# Patient Record
Sex: Male | Born: 1945 | Race: Black or African American | Hispanic: No | Smoking: Current every day smoker
Health system: Southern US, Community
[De-identification: ages and names within clinical notes are randomized; demographics above are authoritative.]

## PROBLEM LIST (undated history)

## (undated) DIAGNOSIS — E119 Type 2 diabetes mellitus without complications: Secondary | ICD-10-CM

## (undated) DIAGNOSIS — E7800 Pure hypercholesterolemia, unspecified: Secondary | ICD-10-CM

## (undated) DIAGNOSIS — I10 Essential (primary) hypertension: Secondary | ICD-10-CM

---

## 2010-12-16 ENCOUNTER — Ambulatory Visit: Payer: Self-pay

## 2011-04-29 ENCOUNTER — Inpatient Hospital Stay: Payer: Self-pay | Admitting: Internal Medicine

## 2012-04-11 IMAGING — CT CT ABD-PELV W/O CM
1 of 2 series · 15 of 32 positions shown, 19 images · non-contrast
Comparison: None

REASON FOR EXAM: (1) severe lower abd pain; (2) see above; po contrast
only
COMMENTS:

PROCEDURE:     CT  - CT ABDOMEN AND PELVIS W[DATE]  [DATE]
RESULT:     Indication: Lower abdominal pain
TECHNIQUE: Multiple axial images from the lung bases to the symphysis pubis
were obtained with oral and without intravenous contrast.

[Series 2: 3mm soft tissue · axial · 0.79mm/px · z∈[-836,-395]mm · 15 of 161 slices shown, 19 images]
[im 7/161  soft-tissue]
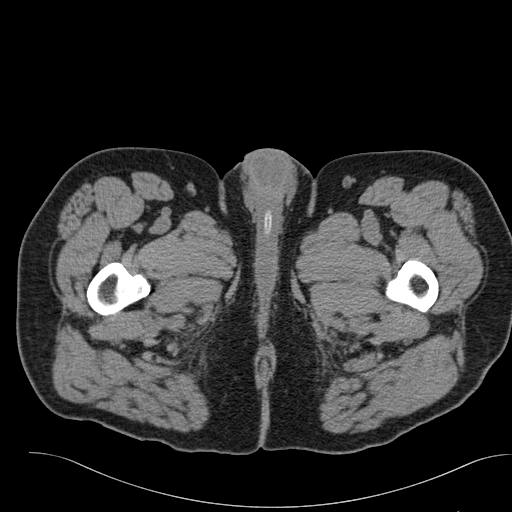
[im 7/161  bone]
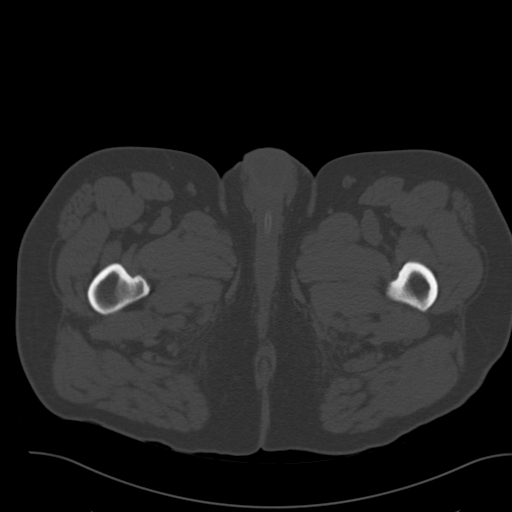
[im 20/161  soft-tissue]
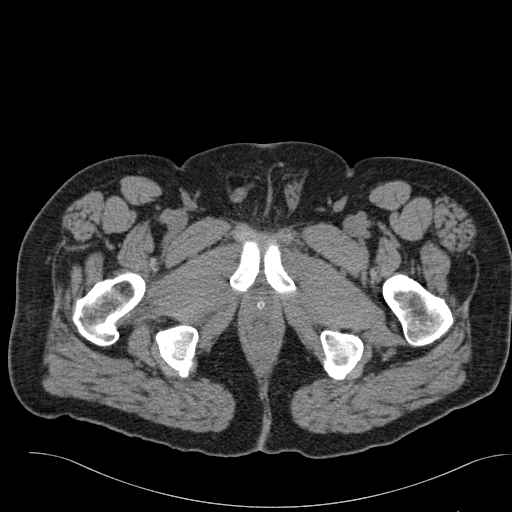
[im 33/161  soft-tissue]
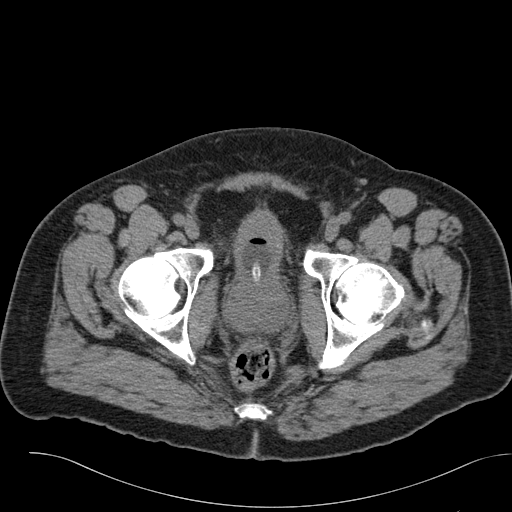
[im 45/161  soft-tissue]
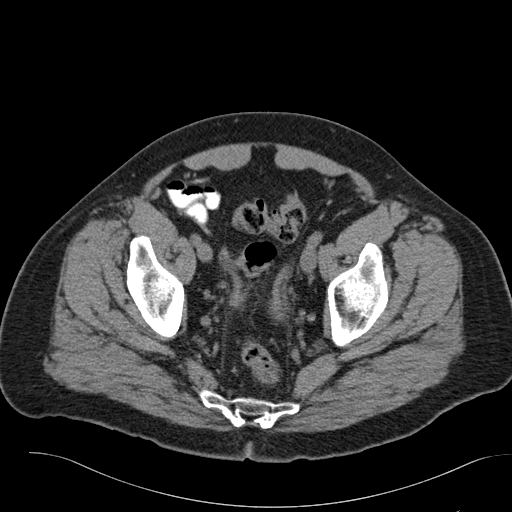
[im 58/161  soft-tissue]
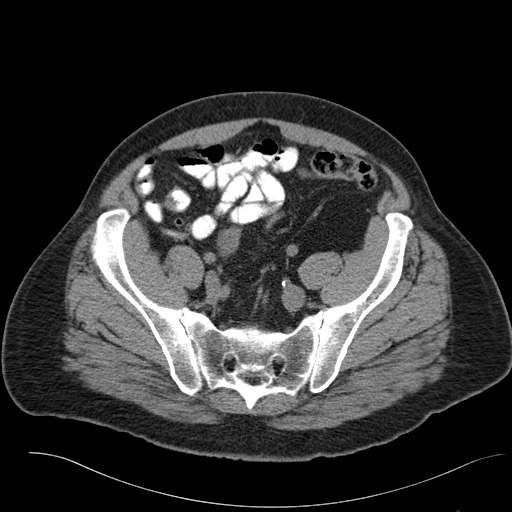
[im 71/161  soft-tissue]
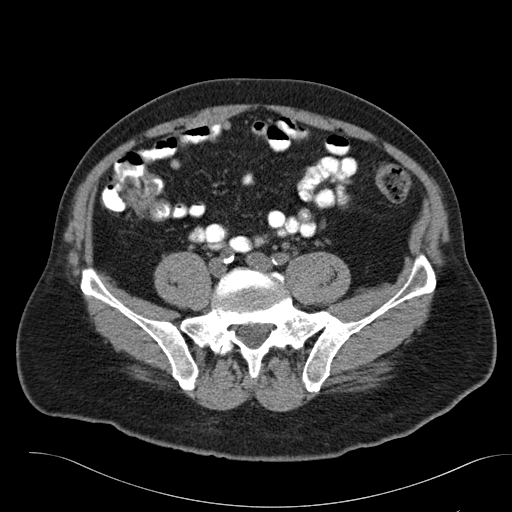
[im 84/161  soft-tissue]
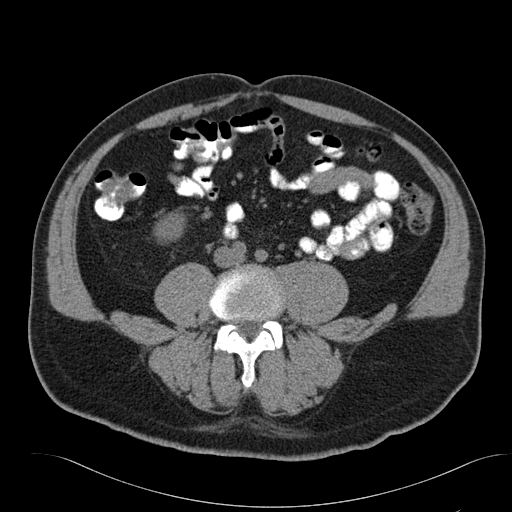
[im 90/161  soft-tissue]
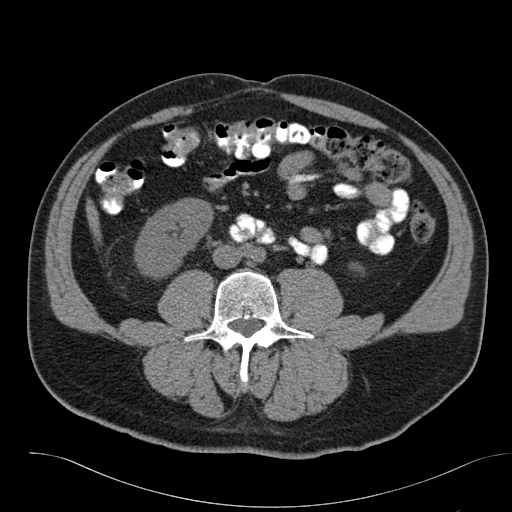
[im 103/161  soft-tissue]
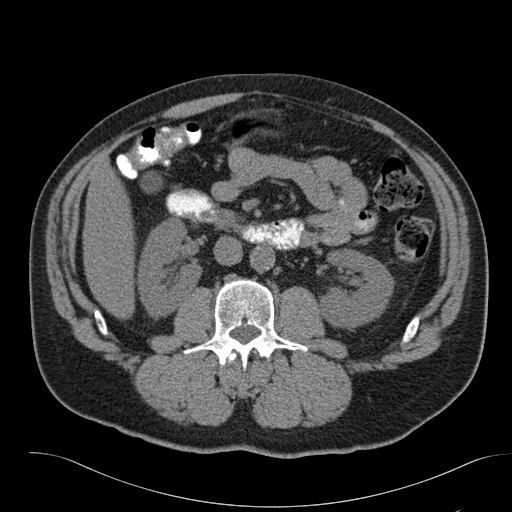
[im 103/161  bone]
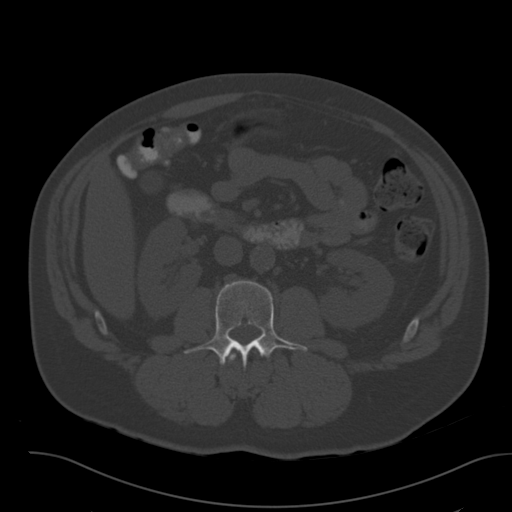
[im 116/161  soft-tissue]
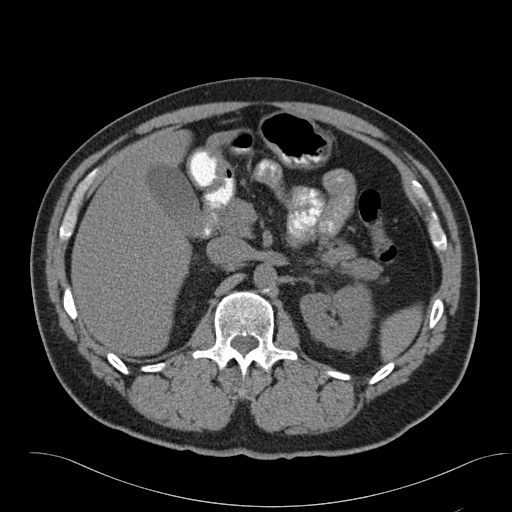
[im 129/161  soft-tissue]
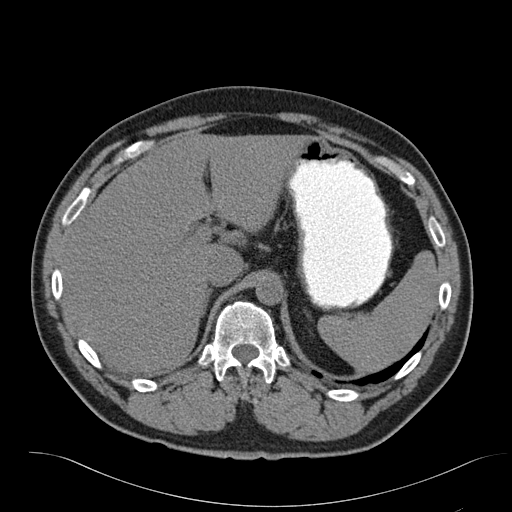
[im 135/161  lung]
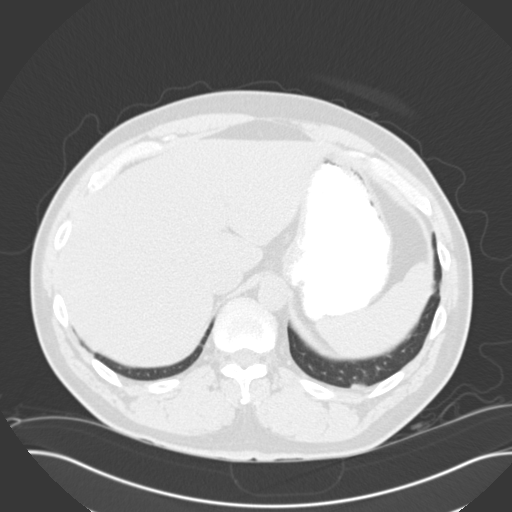
[im 141/161  soft-tissue]
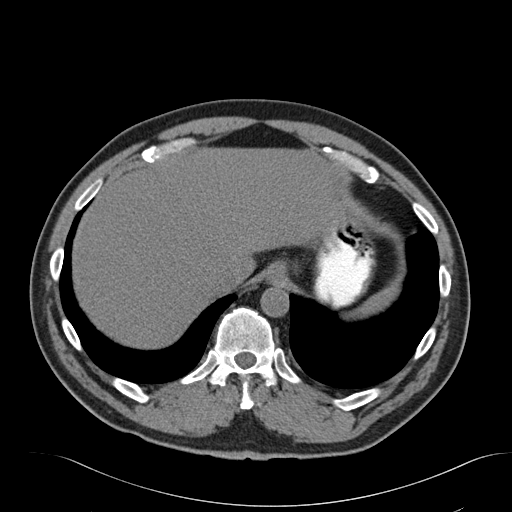
[im 141/161  lung]
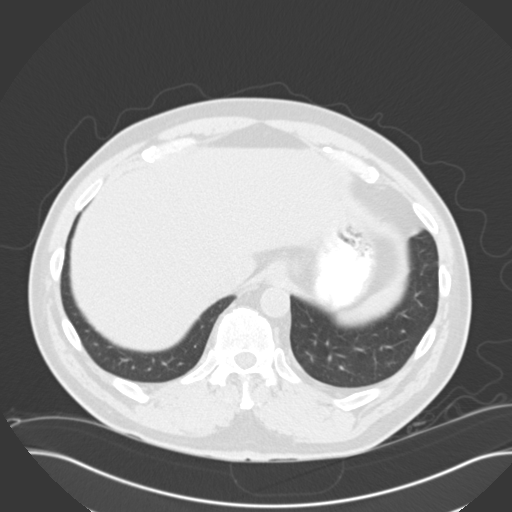
[im 148/161  lung]
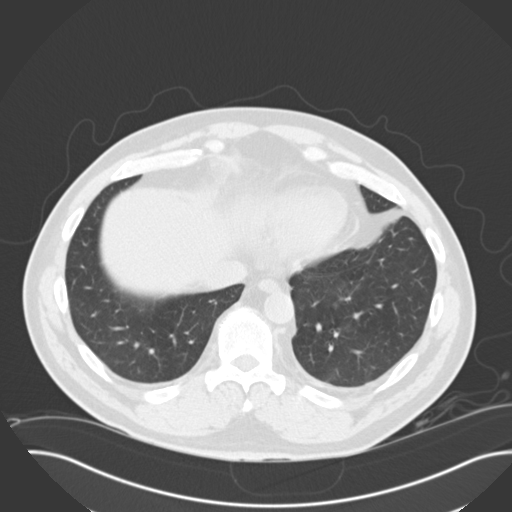
[im 154/161  soft-tissue]
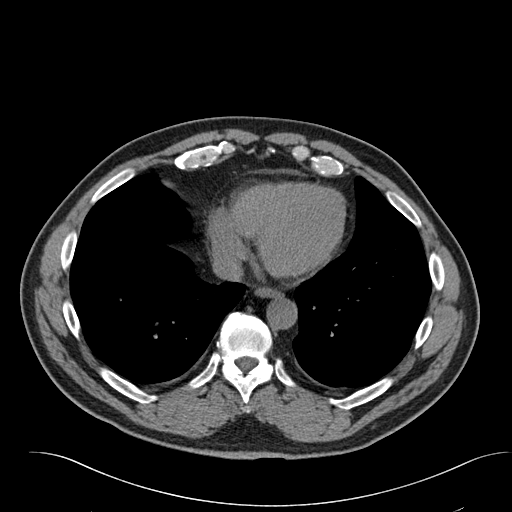
[im 154/161  lung]
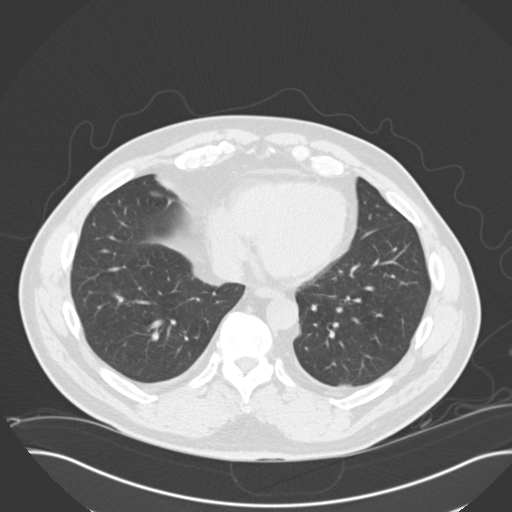

[15 of 32 positions shown; findings below may reference images not displayed]

FINDINGS: The lung bases are clear. There is no pleural or pericardial effusions.

No renal, ureteral, or bladder calculi. No obstructive uropathy. No
perinephric stranding is seen. The kidneys are symmetric in size without
evidence for exophytic mass. The prostate gland is enlarged measuring 5.5 x
5.6 cm. There is a Foley catheter within the bladder.

The liver demonstrates no focal abnormality. The gallbladder is
unremarkable. The spleen demonstrates no focal abnormality. The adrenal
glands and pancreas are normal.

The unopacified stomach, duodenum, small intestine, and large intestine are
unremarkable, but evaluation is limited by lack of oral contrast. There is a
normal caliber appendix in the right lower quadrant without periappendiceal
inflammatory changes. There is no pneumoperitoneum, pneumatosis, or portal
venous gas. There is no abdominal or pelvic free fluid. There is no
lymphadenopathy.

The abdominal aorta is normal in caliber with atherosclerosis.

The osseous structures are unremarkable.
IMPRESSION: 1. No urolithiasis or obstructive uropathy.

2. Prostatic enlargement. Correlate with PSA and physical exam.

## 2015-01-22 ENCOUNTER — Emergency Department
Admission: EM | Admit: 2015-01-22 | Discharge: 2015-01-22 | Disposition: A | Discharge status: Home / Self Care | Payer: Commercial Managed Care - HMO | Attending: Emergency Medicine | Admitting: Emergency Medicine

## 2015-01-22 ENCOUNTER — Encounter: Payer: Self-pay | Admitting: Emergency Medicine

## 2015-01-22 DIAGNOSIS — I10 Essential (primary) hypertension: Secondary | ICD-10-CM | POA: Insufficient documentation

## 2015-01-22 DIAGNOSIS — Z880 Allergy status to penicillin: Secondary | ICD-10-CM | POA: Diagnosis not present

## 2015-01-22 DIAGNOSIS — Z79899 Other long term (current) drug therapy: Secondary | ICD-10-CM | POA: Diagnosis not present

## 2015-01-22 DIAGNOSIS — Z720 Tobacco use: Secondary | ICD-10-CM | POA: Diagnosis not present

## 2015-01-22 DIAGNOSIS — E119 Type 2 diabetes mellitus without complications: Secondary | ICD-10-CM | POA: Diagnosis not present

## 2015-01-22 DIAGNOSIS — M79674 Pain in right toe(s): Secondary | ICD-10-CM | POA: Diagnosis present

## 2015-01-22 DIAGNOSIS — M109 Gout, unspecified: Secondary | ICD-10-CM

## 2015-01-22 HISTORY — DX: Essential (primary) hypertension: I10

## 2015-01-22 HISTORY — DX: Pure hypercholesterolemia, unspecified: E78.00

## 2015-01-22 HISTORY — DX: Type 2 diabetes mellitus without complications: E11.9

## 2015-01-22 LAB — BASIC METABOLIC PANEL
Anion gap: 7 (ref 5–15)
BUN: 14 mg/dL (ref 6–20)
CALCIUM: 9.2 mg/dL (ref 8.9–10.3)
CO2: 31 mmol/L (ref 22–32)
Chloride: 101 mmol/L (ref 101–111)
Creatinine, Ser: 1.39 mg/dL — ABNORMAL HIGH (ref 0.61–1.24)
GFR calc Af Amer: 59 mL/min — ABNORMAL LOW (ref >60)
GFR, EST NON AFRICAN AMERICAN: 51 mL/min — AB (ref >60)
Glucose, Bld: 108 mg/dL — ABNORMAL HIGH (ref 65–99)
Potassium: 4 mmol/L (ref 3.5–5.1)
SODIUM: 139 mmol/L (ref 135–145)

## 2015-01-22 LAB — CBC WITH DIFFERENTIAL/PLATELET
Basophils Absolute: 0.1 10*3/uL (ref 0–0.1)
Basophils Relative: 1 %
EOS ABS: 0.1 10*3/uL (ref 0–0.7)
Eosinophils Relative: 2 %
HEMATOCRIT: 48.8 % (ref 40.0–52.0)
Hemoglobin: 15.8 g/dL (ref 13.0–18.0)
LYMPHS ABS: 2.7 10*3/uL (ref 1.0–3.6)
Lymphocytes Relative: 33 %
MCH: 27.2 pg (ref 26.0–34.0)
MCHC: 32.5 g/dL (ref 32.0–36.0)
MCV: 83.8 fL (ref 80.0–100.0)
Monocytes Absolute: 0.6 10*3/uL (ref 0.2–1.0)
Monocytes Relative: 7 %
Neutro Abs: 4.7 10*3/uL (ref 1.4–6.5)
Neutrophils Relative %: 57 %
Platelets: 243 10*3/uL (ref 150–440)
RBC: 5.83 MIL/uL (ref 4.40–5.90)
RDW: 14.7 % — AB (ref 11.5–14.5)
WBC: 8.2 10*3/uL (ref 3.8–10.6)

## 2015-01-22 LAB — URIC ACID: URIC ACID, SERUM: 8.1 mg/dL — AB (ref 4.4–7.6)

## 2015-01-22 MED ORDER — COLCHICINE 0.6 MG PO TABS: 0.6000 mg | ORAL_TABLET | Freq: Every day | ORAL | Status: AC

## 2015-01-22 MED ORDER — NAPROXEN 500 MG PO TABS: 500.0000 mg | ORAL_TABLET | Freq: Two times a day (BID) | ORAL | Status: DC

## 2015-01-22 NOTE — ED Provider Notes (Signed)
[facility] Emergency Department Provider Note  ____________________________________________  Time seen: Approximately 10:47 AM  I have reviewed the triage vital signs and the nursing notes.   HISTORY  Chief Complaint Toe Pain    HPI Anthony Roy is a 69 y.o. male complain about 3 days of swelling and redness to the right hallux. Patient denies any provocative incident for this complaint. Patient state is only painful impressions applied against the swelling. At that time the pain is dull and he rates it as a 4/10. No palliative measures taken for this complaint.   Past Medical History  Diagnosis Date  . Diabetes mellitus without complication   . Hypertension   . Hypercholesteremia     There are no active problems to display for this patient.   History reviewed. No pertinent past surgical history.  Current Outpatient Rx  Name  Route  Sig  Dispense  Refill  . atorvastatin (LIPITOR) 40 MG tablet   Oral   Take 40 mg by mouth daily.         . hydrochlorothiazide (HYDRODIURIL) 25 MG tablet   Oral   Take 25 mg by mouth daily.         Marland Kitchen lisinopril (PRINIVIL,ZESTRIL) 40 MG tablet   Oral   Take 40 mg by mouth daily.         . metFORMIN (GLUCOPHAGE) 500 MG tablet   Oral   Take 500 mg by mouth 2 (two) times daily with a meal.         . tamsulosin (FLOMAX) 0.4 MG CAPS capsule   Oral   Take 0.4 mg by mouth.         . colchicine 0.6 MG tablet   Oral   Take 1 tablet (0.6 mg total) by mouth daily.   30 tablet   2   . naproxen (NAPROSYN) 500 MG tablet   Oral   Take 1 tablet (500 mg total) by mouth 2 (two) times daily with a meal.   20 tablet   0     Allergies Penicillins  No family history on file.  Social History History  Substance Use Topics  . Smoking status: Current Every Day Smoker  . Smokeless tobacco: Not on file  . Alcohol Use: No    Review of Systems Constitutional: No fever/chills Eyes: No visual  changes. ENT: No sore throat. Cardiovascular: Denies chest pain. Respiratory: Denies shortness of breath. Gastrointestinal: No abdominal pain.  No nausea, no vomiting.  No diarrhea.  No constipation. Genitourinary: Negative for dysuria. Musculoskeletal: Negative for back pain. Skin: Negative for rash. Neurological: Negative for headaches, focal weakness or numbness. Endocrine:Diabetes hypertension and hyperlipidemia. Allergic/Immunilogical: Penicillin  10-point ROS otherwise negative.  ____________________________________________   PHYSICAL EXAM:  VITAL SIGNS: ED Triage Vitals  Enc Vitals Group     BP 01/22/15 1043 170/74 mmHg     Pulse Rate 01/22/15 1043 85     Resp --      Temp 01/22/15 1043 98.3 F (36.8 C)     Temp Source 01/22/15 1043 Oral     SpO2 01/22/15 1043 95 %     Weight 01/22/15 1043 240 lb (108.863 kg)     Height 01/22/15 1043 6\' 2"  (1.88 m)     Head Cir --      Peak Flow --      Pain Score --      Pain Loc --      Pain Edu? --  Excl. in GC? --     Constitutional: Alert and oriented. Well appearing and in no acute distress. Eyes: Conjunctivae are normal. PERRL. EOMI. Head: Atraumatic. Nose: No congestion/rhinnorhea. Mouth/Throat: Mucous membranes are moist.  Oropharynx non-erythematous. Neck: No stridor. No cervical spine tenderness to palpation. Hematological/Lymphatic/Immunilogical: No cervical lymphadenopathy. Cardiovascular: Normal rate, regular rhythm. Grossly normal heart sounds.  Good peripheral circulation. Elevated systolic blood pressure Respiratory: Normal respiratory effort.  No retractions. Lungs CTAB. Gastrointestinal: Soft and nontender. No distention. No abdominal bruits. No CVA tenderness. Genitourinary:  Musculoskeletal: Obvious edema to the right hallux. Neurologic:  Normal speech and language. No gross focal neurologic deficits are appreciated. No gait instability. Skin:  Medial aspect of the right hallux is erythematous and  edematous. Psychiatric: Mood and affect are normal. Speech and behavior are normal.  ____________________________________________   LABS (all labs ordered are listed, but only abnormal results are displayed)  Labs Reviewed  URIC ACID - Abnormal; Notable for the following:    Uric Acid, Serum 8.1 (*)    All other components within normal limits  CBC WITH DIFFERENTIAL/PLATELET - Abnormal; Notable for the following:    RDW 14.7 (*)    All other components within normal limits  BASIC METABOLIC PANEL - Abnormal; Notable for the following:    Glucose, Bld 108 (*)    Creatinine, Ser 1.39 (*)    GFR calc non Af Amer 51 (*)    GFR calc Af Amer 59 (*)    All other components within normal limits   ____________________________________________  EKG   ____________________________________________  RADIOLOGY   ____________________________________________   PROCEDURES  Procedure(s) performed: None  Critical Care performed: No  ____________________________________________   INITIAL IMPRESSION / ASSESSMENT AND PLAN / ED COURSE  Pertinent labs & imaging results that were available during my care of the patient were reviewed by me and considered in my medical decision making (see chart for details).  Patient physical exam and labs reveal that this is a onset of gout. Patient given prescription put in the INaproxen and Colchicine. [facility]. FINAL CLINICAL IMPRESSION(S) / ED DIAGNOSES  Final diagnoses:  Gout of big toe      Joni Reining, PA-C 01/22/15 1219  Phineas Semen, MD 01/22/15 1236

## 2015-01-22 NOTE — Discharge Instructions (Signed)
°  Advised to follow up with [facility] [place] for continual care. Gout Gout is when your joints become red, sore, and swell (inflamed). This is caused by the buildup of uric acid crystals in the joints. Uric acid is a chemical that is normally in the blood. If the level of uric acid gets too high in the blood, these crystals form in your joints and tissues. Over time, these crystals can form into masses near the joints and tissues. These masses can destroy bone and cause the bone to look misshapen (deformed). HOME CARE   Do not take aspirin for pain.  Only take medicine as told by your doctor.  Rest the joint as much as you can. When in bed, keep sheets and blankets off painful areas.  Keep the sore joints raised (elevated).  Put warm or cold packs on painful joints. Use of warm or cold packs depends on which works best for you.  Use crutches if the painful joint is in your leg.  Drink enough fluids to keep your pee (urine) clear or pale yellow. Limit alcohol, sugary drinks, and drinks with fructose in them.  Follow your diet instructions. Pay careful attention to how much protein you eat. Include fruits, vegetables, whole grains, and fat-free or low-fat milk products in your daily diet. Talk to your doctor or dietitian about the use of coffee, vitamin C, and cherries. These may help lower uric acid levels.  Keep a healthy body weight. GET HELP RIGHT AWAY IF:   You have watery poop (diarrhea), throw up (vomit), or have any side effects from medicines.  You do not feel better in 24 hours, or you are getting worse.  Your joint becomes suddenly more tender, and you have chills or a fever. MAKE SURE YOU:   Understand these instructions.  Will watch your condition.  Will get help right away if you are not doing well or get worse. Document Released: 03/10/2008 Document Revised: 10/16/2013 Document Reviewed: 01/13/2012 [facility] Patient Information 2015 [place], [place]. This information  is not intended to replace advice given to you by your health care provider. Make sure you discuss any questions you have with your health care provider.

## 2015-01-22 NOTE — ED Notes (Signed)
RIGHT FOOT SWELLING FOR SEVERAL DAYS   SWELLING REMAINS IN TOE

## 2015-05-31 DIAGNOSIS — H524 Presbyopia: Secondary | ICD-10-CM | POA: Diagnosis not present

## 2015-05-31 DIAGNOSIS — H5210 Myopia, unspecified eye: Secondary | ICD-10-CM | POA: Diagnosis not present

## 2015-06-01 DIAGNOSIS — E1169 Type 2 diabetes mellitus with other specified complication: Secondary | ICD-10-CM | POA: Diagnosis not present

## 2015-06-01 DIAGNOSIS — N400 Enlarged prostate without lower urinary tract symptoms: Secondary | ICD-10-CM | POA: Diagnosis not present

## 2015-06-01 DIAGNOSIS — F17200 Nicotine dependence, unspecified, uncomplicated: Secondary | ICD-10-CM | POA: Diagnosis not present

## 2015-06-01 DIAGNOSIS — E782 Mixed hyperlipidemia: Secondary | ICD-10-CM | POA: Diagnosis not present

## 2015-06-01 DIAGNOSIS — M1000 Idiopathic gout, unspecified site: Secondary | ICD-10-CM | POA: Diagnosis not present

## 2015-06-01 DIAGNOSIS — Z0000 Encounter for general adult medical examination without abnormal findings: Secondary | ICD-10-CM | POA: Diagnosis not present

## 2015-06-01 DIAGNOSIS — E669 Obesity, unspecified: Secondary | ICD-10-CM | POA: Diagnosis not present

## 2015-06-01 DIAGNOSIS — I493 Ventricular premature depolarization: Secondary | ICD-10-CM | POA: Diagnosis not present

## 2015-11-13 DIAGNOSIS — H168 Other keratitis: Secondary | ICD-10-CM | POA: Diagnosis not present

## 2015-11-20 DIAGNOSIS — H16392 Other interstitial and deep keratitis, left eye: Secondary | ICD-10-CM | POA: Diagnosis not present

## 2020-03-19 DIAGNOSIS — E7800 Pure hypercholesterolemia, unspecified: Secondary | ICD-10-CM | POA: Insufficient documentation

## 2020-03-19 DIAGNOSIS — I10 Essential (primary) hypertension: Secondary | ICD-10-CM | POA: Insufficient documentation

## 2020-03-19 DIAGNOSIS — E119 Type 2 diabetes mellitus without complications: Secondary | ICD-10-CM | POA: Insufficient documentation

## 2020-05-21 ENCOUNTER — Emergency Department: Payer: Medicare HMO

## 2020-05-21 ENCOUNTER — Other Ambulatory Visit: Payer: Self-pay

## 2020-05-21 ENCOUNTER — Emergency Department
Admission: EM | Admit: 2020-05-21 | Discharge: 2020-05-21 | Disposition: A | Discharge status: Home / Self Care | Payer: Medicare HMO | Attending: Emergency Medicine | Admitting: Emergency Medicine

## 2020-05-21 DIAGNOSIS — M25552 Pain in left hip: Secondary | ICD-10-CM | POA: Diagnosis not present

## 2020-05-21 DIAGNOSIS — N503 Cyst of epididymis: Secondary | ICD-10-CM | POA: Diagnosis not present

## 2020-05-21 DIAGNOSIS — I861 Scrotal varices: Secondary | ICD-10-CM | POA: Diagnosis not present

## 2020-05-21 DIAGNOSIS — M5432 Sciatica, left side: Secondary | ICD-10-CM | POA: Diagnosis not present

## 2020-05-21 DIAGNOSIS — N492 Inflammatory disorders of scrotum: Secondary | ICD-10-CM | POA: Insufficient documentation

## 2020-05-21 DIAGNOSIS — N5089 Other specified disorders of the male genital organs: Secondary | ICD-10-CM | POA: Diagnosis not present

## 2020-05-21 DIAGNOSIS — N433 Hydrocele, unspecified: Secondary | ICD-10-CM | POA: Diagnosis not present

## 2020-05-21 DIAGNOSIS — Z79899 Other long term (current) drug therapy: Secondary | ICD-10-CM | POA: Diagnosis not present

## 2020-05-21 DIAGNOSIS — I10 Essential (primary) hypertension: Secondary | ICD-10-CM | POA: Insufficient documentation

## 2020-05-21 DIAGNOSIS — F17200 Nicotine dependence, unspecified, uncomplicated: Secondary | ICD-10-CM | POA: Diagnosis not present

## 2020-05-21 DIAGNOSIS — Z7984 Long term (current) use of oral hypoglycemic drugs: Secondary | ICD-10-CM | POA: Insufficient documentation

## 2020-05-21 DIAGNOSIS — E119 Type 2 diabetes mellitus without complications: Secondary | ICD-10-CM | POA: Diagnosis not present

## 2020-05-21 MED ORDER — METHOCARBAMOL 500 MG PO TABS: 500.0000 mg | ORAL_TABLET | Freq: Four times a day (QID) | ORAL | 0 refills | Status: AC

## 2020-05-21 MED ORDER — OXYCODONE-ACETAMINOPHEN 5-325 MG PO TABS: 1.0000 | ORAL_TABLET | Freq: Four times a day (QID) | ORAL | 0 refills | Status: AC | PRN

## 2020-05-21 MED ORDER — PREDNISONE 10 MG (21) PO TBPK: ORAL_TABLET | ORAL | 0 refills | Status: DC

## 2020-05-21 NOTE — ED Notes (Signed)
Pt states pain from nerve in hip. Pt able to ambulate but painful.

## 2020-05-21 NOTE — ED Provider Notes (Signed)
[facility] Emergency Department Provider Note ____________________________________________   First MD Initiated Contact with Patient 05/21/20 1817     (approximate)  I have reviewed the triage vital signs and the nursing notes.   HISTORY  Chief Complaint Hip Pain and Testicle Pain  HPI Anthony Roy is a 74 y.o. male with history of diabetes, hypertension, and high colesterol presents to the emergency department for treatment and evaluation of non-traumatic left hip pain and left testicle swelling. Hip pain started 2 weeks ago. Testicle swelling started about 1 month ago. No pain in the testicle and no difficulty urinating or penile discharge.         Past Medical History:  Diagnosis Date  . Diabetes mellitus without complication (HCC)   . Hypercholesteremia   . Hypertension     Patient Active Problem List   Diagnosis Date Noted  . Hypertension   . Hypercholesteremia   . Diabetes mellitus without complication (HCC)     History reviewed. No pertinent surgical history.  Prior to Admission medications   Medication Sig Start Date End Date Taking? Authorizing Provider  atorvastatin (LIPITOR) 40 MG tablet Take 40 mg by mouth daily.    [provider]  colchicine 0.6 MG tablet Take 1 tablet (0.6 mg total) by mouth daily. 01/22/15 01/22/16  Joni Reining, PA-C  hydrochlorothiazide (HYDRODIURIL) 25 MG tablet Take 25 mg by mouth daily.    [provider]  lisinopril (PRINIVIL,ZESTRIL) 40 MG tablet Take 40 mg by mouth daily.    [provider]  metFORMIN (GLUCOPHAGE) 500 MG tablet Take 500 mg by mouth 2 (two) times daily with a meal.    [provider]  methocarbamol (ROBAXIN) 500 MG tablet Take 1 tablet (500 mg total) by mouth 4 (four) times daily. 05/21/20   Orianna Biskup, Rulon Eisenmenger B, FNP  naproxen (NAPROSYN) 500 MG tablet Take 1 tablet (500 mg total) by mouth 2 (two) times daily with a meal. 01/22/15   Joni Reining, PA-C   oxyCODONE-acetaminophen (PERCOCET) 5-325 MG tablet Take 1 tablet by mouth every 6 (six) hours as needed. 05/21/20 05/21/21  Tonie Elsey, Kasandra Knudsen, FNP  predniSONE (STERAPRED UNI-PAK 21 TAB) 10 MG (21) TBPK tablet Take 6 tablets on the first day and decrease by 1 tablet each day until finished. 05/21/20   Raygen Linquist, Rulon Eisenmenger B, FNP  tamsulosin (FLOMAX) 0.4 MG CAPS capsule Take 0.4 mg by mouth.    [provider]    Allergies Penicillins  History reviewed. No pertinent family history.  Social History Social History   Tobacco Use  . Smoking status: Current Every Day Smoker  Substance Use Topics  . Alcohol use: No  . Drug use: Not on file    Review of Systems  Constitutional: No fever/chills Eyes: No visual changes. ENT: No sore throat. Cardiovascular: Denies chest pain. Respiratory: Denies shortness of breath. Gastrointestinal: No abdominal pain.  No nausea, no vomiting.  No diarrhea.  No constipation. Genitourinary: Negative for dysuria. Positive for left testicle enlargement. Musculoskeletal: Positive for radicular left hip pain. Skin: Negative for rash. Neurological: Negative for headaches, focal weakness or numbness. ___________________________________________   PHYSICAL EXAM:  VITAL SIGNS: ED Triage Vitals  Enc Vitals Group     BP 05/21/20 1630 (!) 158/91     Pulse Rate 05/21/20 1628 72     Resp 05/21/20 1628 18     Temp 05/21/20 1628 98.9 F (37.2 C)     Temp Source 05/21/20 1628 Oral  SpO2 05/21/20 1628 98 %     Weight 05/21/20 1628 252 lb (114.3 kg)     Height 05/21/20 1628 6\' 2"  (1.88 m)     Head Circumference --      Peak Flow --      Pain Score 05/21/20 1628 7     Pain Loc --      Pain Edu? --      Excl. in GC? --     Constitutional: Alert and oriented. Well appearing and in no acute distress. Eyes: Conjunctivae are normal. Head: Atraumatic. Nose: No congestion/rhinnorhea. Mouth/Throat: Mucous membranes are moist.  Oropharynx  non-erythematous. Neck: No stridor.   Hematological/Lymphatic/Immunilogical: No cervical lymphadenopathy. Cardiovascular: Normal rate, regular rhythm. Grossly normal heart sounds.  Good peripheral circulation. Respiratory: Normal respiratory effort.  No retractions. Lungs CTAB. Gastrointestinal: Soft and nontender. No distention. No abdominal bruits. No CVA tenderness. Genitourinary: Left testicle larger than right. No erythema. Cremasteric reflex intact. Nontender. Musculoskeletal: No lower extremity tenderness nor edema.  Left hip pain with radiation into the left buttock and posterior thigh. Neurologic:  Normal speech and language. No gross focal neurologic deficits are appreciated. No gait instability. Skin:  Skin is warm, dry and intact. No rash noted. Psychiatric: Mood and affect are normal. Speech and behavior are normal.  ____________________________________________   LABS (all labs ordered are listed, but only abnormal results are displayed)  Labs Reviewed - No data to display ____________________________________________  EKG  Not indicated ____________________________________________  RADIOLOGY  ED MD interpretation:    Image of the left hip and pelvis are negative for acute concerns however there is a nonaggressive appearing sclerotic and lucent lesion within the proximal left femur.  I, 14/07/21, personally viewed and evaluated these images (plain radiographs) as part of my medical decision making, as well as reviewing the written report by the radiologist.  Official radiology report(s): [place] SCROTUM W/DOPPLER  Result Date: 05/21/2020 CLINICAL DATA:  Left testicular swelling x several months. EXAM: SCROTAL ULTRASOUND DOPPLER ULTRASOUND OF THE TESTICLES TECHNIQUE: Complete ultrasound examination of the testicles, epididymis, and other scrotal structures was performed. Color and spectral Doppler ultrasound were also utilized to evaluate blood flow to the testicles.  COMPARISON:  None. FINDINGS: Right testicle Measurements: 4.3 cm x 2.6 cm x 2.7 cm. No mass or microlithiasis visualized. Left testicle Measurements: 3.5 cm x 2.6 cm x 2.5 cm. No mass or microlithiasis visualized. Right epididymis: A 0.2 cm x 0.3 cm x 0.1 cm anechoic area is seen within the right epididymis. No abnormal flow is seen within this region on color Doppler evaluation. Left epididymis: A 0.5 cm x 0.4 cm x 0.5 cm anechoic structure is seen within the left epididymis. No abnormal flow is seen within this region on color Doppler evaluation. Hydrocele: Large bilateral hydroceles are seen, left greater than right. Varicocele:  A small right-sided varicocele is noted. Pulsed Doppler interrogation of both testes demonstrates normal low resistance arterial and venous waveforms bilaterally. IMPRESSION: 1. Large bilateral hydroceles, left greater than right. 2. Small bilateral epididymal cysts. 3. Small right-sided varicocele. Electronically Signed   By: 14/12/2019 M.D.   On: 05/21/2020 19:47   DG Hip Unilat With Pelvis 2-3 Views Left  Result Date: 05/21/2020 CLINICAL DATA:  Left-sided hip pain EXAM: DG HIP (WITH OR WITHOUT PELVIS) 2-3V LEFT COMPARISON:  CT 04/29/2011 FINDINGS: SI joints are patent. Pubic symphysis and rami appear intact. No fracture or malalignment. Mild degenerative changes of both hips. Left femoral neck/trochanter bone lesion with somewhat  thick rim of sclerosis and central lucency. IMPRESSION: 1. No acute osseous abnormality. 2. Mixed sclerotic and lucent lesion within the proximal left femur with nonaggressive appearing features though if focal pain to the region, MRI on a nonemergent basis could be considered for further evaluation Electronically Signed   By: Jasmine Pang M.D.   On: 05/21/2020 17:38    ____________________________________________   PROCEDURES  Procedure(s) performed (including Critical  Care):  Procedures  ____________________________________________   INITIAL IMPRESSION / ASSESSMENT AND PLAN     74 year old male presenting to the emergency department for treatment and evaluation of left hip pain with radiation into the left posterior thigh.  He also has concerns of nonpainful left testicle edema that has been present for the past month.  See HPI for further details.  Plan will be to review images of the hip and obtain ultrasound of the scrotum to include Doppler study.  DIFFERENTIAL DIAGNOSIS  Sciatica, hip fracture, hydrocele, scrotal mass  ED COURSE  Image of the left hip shows no acute concerns.  Patient has no focal tenderness on exam.  Exam is most consistent with sciatica.  Ultrasound of the left testicle shows hydrocele.  This is not likely infectious as the patient has no pain in the testicle and denies penile discharge.  Patient will be treated with prednisone, Robaxin, and Norco for the sciatica.  He will be encouraged to follow-up with primary care for further evaluation of the hydrocele.  Prior to patient being discharged, [facility] called regarding prescription for Norco.  Patient recently had morphine and another narcotic filled through the [place].  These did not show on the PDMP report.  Pharmacist was asked to cancel the prescription sent for Norco.    ___________________________________________   FINAL CLINICAL IMPRESSION(S) / ED DIAGNOSES  Final diagnoses:  Testicle swelling  Acute hydrocele  Sciatica of left side     ED Discharge Orders         Ordered    predniSONE (STERAPRED UNI-PAK 21 TAB) 10 MG (21) TBPK tablet        05/21/20 2009    oxyCODONE-acetaminophen (PERCOCET) 5-325 MG tablet  Every 6 hours PRN        05/21/20 2009    methocarbamol (ROBAXIN) 500 MG tablet  4 times daily        05/21/20 2009           Anthony Roy was evaluated in Emergency Department on 05/21/2020 for the symptoms described in the history of  present illness. He was evaluated in the context of the global COVID-19 pandemic, which necessitated consideration that the patient might be at risk for infection with the SARS-CoV-2 virus that causes COVID-19. Institutional protocols and algorithms that pertain to the evaluation of patients at risk for COVID-19 are in a state of rapid change based on information released by regulatory bodies including the CDC and federal and state organizations. These policies and algorithms were followed during the patient's care in the ED.   Note:  This document was prepared using Dragon voice recognition software and may include unintentional dictation errors.   Chinita Pester, FNP 05/21/20 2245    Sharman Cheek, MD 05/21/20 2359

## 2020-05-21 NOTE — ED Triage Notes (Signed)
Pt states left hip pain for about 2 weeks. Also states new left testicle pain/swelling. Denies dysuria. Denies injury to hip.

## 2020-05-21 NOTE — Discharge Instructions (Addendum)
Your ultrasound shows that you have extra fluid in your left testicle. This should resolve on it's own. If it does not get better or if you develop pain, please [facility] or return to the ER.  Take medication as prescribed for hip pain.

## 2022-08-19 ENCOUNTER — Emergency Department
Admission: EM | Admit: 2022-08-19 | Discharge: 2022-08-19 | Disposition: A | Discharge status: Home / Self Care | Payer: Medicare HMO | Attending: Emergency Medicine | Admitting: Emergency Medicine

## 2022-08-19 ENCOUNTER — Emergency Department: Payer: Medicare HMO

## 2022-08-19 ENCOUNTER — Other Ambulatory Visit: Payer: Self-pay

## 2022-08-19 ENCOUNTER — Encounter: Payer: Self-pay | Admitting: Emergency Medicine

## 2022-08-19 DIAGNOSIS — E1122 Type 2 diabetes mellitus with diabetic chronic kidney disease: Secondary | ICD-10-CM | POA: Diagnosis not present

## 2022-08-19 DIAGNOSIS — N189 Chronic kidney disease, unspecified: Secondary | ICD-10-CM | POA: Diagnosis not present

## 2022-08-19 DIAGNOSIS — I129 Hypertensive chronic kidney disease with stage 1 through stage 4 chronic kidney disease, or unspecified chronic kidney disease: Secondary | ICD-10-CM | POA: Insufficient documentation

## 2022-08-19 DIAGNOSIS — M79671 Pain in right foot: Secondary | ICD-10-CM | POA: Insufficient documentation

## 2022-08-19 LAB — CBC WITH DIFFERENTIAL/PLATELET
Abs Immature Granulocytes: 0.02 10*3/uL (ref 0.00–0.07)
Basophils Absolute: 0.1 10*3/uL (ref 0.0–0.1)
Basophils Relative: 1 %
Eosinophils Absolute: 0.1 10*3/uL (ref 0.0–0.5)
Eosinophils Relative: 1 %
HCT: 44.2 % (ref 39.0–52.0)
Hemoglobin: 13.9 g/dL (ref 13.0–17.0)
Immature Granulocytes: 0 %
Lymphocytes Relative: 25 %
Lymphs Abs: 2.3 10*3/uL (ref 0.7–4.0)
MCH: 26.7 pg (ref 26.0–34.0)
MCHC: 31.4 g/dL (ref 30.0–36.0)
MCV: 85 fL (ref 80.0–100.0)
Monocytes Absolute: 1 10*3/uL (ref 0.1–1.0)
Monocytes Relative: 11 %
Neutro Abs: 5.5 10*3/uL (ref 1.7–7.7)
Neutrophils Relative %: 62 %
Platelets: 310 10*3/uL (ref 150–400)
RBC: 5.2 MIL/uL (ref 4.22–5.81)
RDW: 13.5 % (ref 11.5–15.5)
WBC: 8.9 10*3/uL (ref 4.0–10.5)
nRBC: 0 % (ref 0.0–0.2)

## 2022-08-19 LAB — URIC ACID: Uric Acid, Serum: 7.6 mg/dL (ref 3.7–8.6)

## 2022-08-19 LAB — BASIC METABOLIC PANEL
Anion gap: 10 (ref 5–15)
BUN: 13 mg/dL (ref 8–23)
CO2: 27 mmol/L (ref 22–32)
Calcium: 9.6 mg/dL (ref 8.9–10.3)
Chloride: 98 mmol/L (ref 98–111)
Creatinine, Ser: 1.33 mg/dL — ABNORMAL HIGH (ref 0.61–1.24)
GFR, Estimated: 56 mL/min — ABNORMAL LOW (ref >60)
Glucose, Bld: 108 mg/dL — ABNORMAL HIGH (ref 70–99)
Potassium: 3.6 mmol/L (ref 3.5–5.1)
Sodium: 135 mmol/L (ref 135–145)

## 2022-08-19 MED ORDER — PREDNISONE 50 MG PO TABS: ORAL_TABLET | ORAL | 0 refills | Status: AC

## 2022-08-19 MED ORDER — PREDNISONE 20 MG PO TABS
50.0000 mg | ORAL_TABLET | Freq: Once | ORAL | Status: AC
  Administered 2022-08-19: 50 mg via ORAL
  Filled 2022-08-19: qty 3

## 2022-08-19 NOTE — Discharge Instructions (Signed)
Take the medication as prescribed, though pay attention to your blood sugar.  Please return for any new, worsening, or change in symptoms or other concerns.  Please try to reduce the amount of red meat and alcohol that you consume as this may precipitate gout flares.  Please return for any new, worsening, or change in symptoms or other concerns.

## 2022-08-19 NOTE — ED Provider Notes (Signed)
[facility] Provider Note    Event Date/Time   First MD Initiated Contact with Patient 08/19/22 0715     (approximate)   History   Foot Pain   HPI  Anthony Roy is a 77 y.o. male with a past medical history of hypertension, hypercholesterolemia, diabetes who presents today for evaluation of right great toe pain.  Patient reports that this began 2 days ago after eating crab salad.  He reports that he has had gout before and this feels similar.  He denies any injury.  No fevers or chills.  He reports that he has pain with ambulation.  Patient Active Problem List   Diagnosis Date Noted   Hypertension    Hypercholesteremia    Diabetes mellitus without complication [facility])           Physical Exam   Triage Vital Signs: ED Triage Vitals  Enc Vitals Group     BP 08/19/22 0648 (!) 182/80     Pulse Rate 08/19/22 0648 92     Resp 08/19/22 0648 20     Temp 08/19/22 0648 98.6 F (37 C)     Temp Source 08/19/22 0648 Oral     SpO2 08/19/22 0648 98 %     Weight 08/19/22 0647 240 lb (108.9 kg)     Height 08/19/22 0647 '6\' 2"'$  (1.88 m)     Head Circumference --      Peak Flow --      Pain Score 08/19/22 0647 7     Pain Loc --      Pain Edu? --      Excl. in [place]? --     Most recent vital signs: Vitals:   08/19/22 0648  BP: (!) 182/80  Pulse: 92  Resp: 20  Temp: 98.6 F (37 C)  SpO2: 98%    Physical Exam Vitals and nursing note reviewed.  Constitutional:      General: Awake and alert. No acute distress.    Appearance: Normal appearance. The patient is normal weight.  HENT:     Head: Normocephalic and atraumatic.     Mouth: Mucous membranes are moist.  Eyes:     General: PERRL. Normal EOMs        Right eye: No discharge.        Left eye: No discharge.     Conjunctiva/sclera: Conjunctivae normal.  Cardiovascular:     Rate and Rhythm: Normal rate and regular rhythm.     Pulses: Normal pulses.  Pulmonary:     Effort: Pulmonary effort is  normal. No respiratory distress.     Breath sounds: Normal breath sounds.  Abdominal:     Abdomen is soft. There is no abdominal tenderness. No rebound or guarding. No distention. Musculoskeletal:        General: No swelling. Normal range of motion.     Cervical back: Normal range of motion and neck supple.  Right foot: Tenderness with erythema to the right great toe. Able to range toe, though pain with doing so. Normal pedal pulse. Normal sensation. No appreciable swelling or ecchymosis. No crepitus. No open wounds or drainage. No lymphangitis Skin:    General: Skin is warm and dry.     Capillary Refill: Capillary refill takes less than 2 seconds.     Findings: No rash.  Neurological:     Mental Status: The patient is awake and alert.      ED Results / Procedures / Treatments   Labs (  all labs ordered are listed, but only abnormal results are displayed) Labs Reviewed  BASIC METABOLIC PANEL - Abnormal; Notable for the following components:      Result Value   Glucose, Bld 108 (*)    Creatinine, Ser 1.33 (*)    GFR, Estimated 56 (*)    All other components within normal limits  CBC WITH DIFFERENTIAL/PLATELET  URIC ACID     EKG     RADIOLOGY I independently reviewed and interpreted imaging and agree with radiologists findings.     PROCEDURES:  Critical Care performed:   Procedures   MEDICATIONS ORDERED IN ED: Medications  predniSONE (DELTASONE) tablet 50 mg (50 mg Oral Given 08/19/22 0842)     IMPRESSION / MDM / ASSESSMENT AND PLAN / ED COURSE  I reviewed the triage vital signs and the nursing notes.   Differential diagnosis includes, but is not limited to, gout/podagra, occult fracture, cellulitis.  Patient is awake and alert, hemodynamically stable and afebrile.  He reports that his symptoms began after eating crab, and feels similar to his previous gout.  There are no open wounds, no lymphangitis, no fever, no constitutional infections to suggest cellulitis.   However, patient is at increased risk given his history of diabetes, and therefore was agreeable to further workup.  Labs obtained reveal no leukocytosis.  X-ray obtained demonstrates no acute bony injury or bony erosion.  He has moderate osteoarthritis of the first MTP which is the location of his pain.  He has a history of diabetes, therefore I am reluctant to treat with steroids.  However, he also has CKD with the decreased GFR.  His glucose appears to be well-controlled, he is not on insulin and today his glucose is 108. Discussed the risks/benefits of treatment with steroids vs NSAIDs, I do believe steroids are safer at this time given his CKD. Patient is in agreement. Recommended keeping a close eye on his blood sugar. He understands and agrees.   Patient's presentation is most consistent with acute complicated illness / injury requiring diagnostic workup.      FINAL CLINICAL IMPRESSION(S) / ED DIAGNOSES   Final diagnoses:  Foot pain, right     Rx / DC Orders   ED Discharge Orders          Ordered    predniSONE (DELTASONE) 50 MG tablet        08/19/22 0846             Note:  This document was prepared using Dragon voice recognition software and may include unintentional dictation errors.   Emeline Gins 08/19/22 1311    Rada Hay, MD 08/19/22 1501

## 2022-08-19 NOTE — ED Notes (Signed)
Pt presents to ED with c/o of R foot pain and swelling for the past 3 days. Pt denies trauma or injury, pt endorses possible gout flare up.

## 2022-08-19 NOTE — ED Triage Notes (Signed)
Patient ambulatory to triage with steady gait, without difficulty or distress noted; pt reports rt foot pain/swelling x 2 days with no known injury; pt st ?gout but denies having hx of same

## 2022-08-28 ENCOUNTER — Emergency Department: Payer: Medicare HMO

## 2022-08-28 ENCOUNTER — Other Ambulatory Visit: Payer: Self-pay

## 2022-08-28 ENCOUNTER — Emergency Department
Admission: EM | Admit: 2022-08-28 | Discharge: 2022-08-28 | Disposition: A | Discharge status: Home / Self Care | Payer: Medicare HMO | Attending: Emergency Medicine | Admitting: Emergency Medicine

## 2022-08-28 DIAGNOSIS — E119 Type 2 diabetes mellitus without complications: Secondary | ICD-10-CM | POA: Insufficient documentation

## 2022-08-28 DIAGNOSIS — I10 Essential (primary) hypertension: Secondary | ICD-10-CM | POA: Insufficient documentation

## 2022-08-28 DIAGNOSIS — E860 Dehydration: Secondary | ICD-10-CM | POA: Diagnosis not present

## 2022-08-28 DIAGNOSIS — R55 Syncope and collapse: Secondary | ICD-10-CM | POA: Diagnosis present

## 2022-08-28 DIAGNOSIS — M10071 Idiopathic gout, right ankle and foot: Secondary | ICD-10-CM | POA: Insufficient documentation

## 2022-08-28 LAB — COMPREHENSIVE METABOLIC PANEL
ALT: 26 U/L (ref 0–44)
AST: 19 U/L (ref 15–41)
Albumin: 3.9 g/dL (ref 3.5–5.0)
Alkaline Phosphatase: 85 U/L (ref 38–126)
Anion gap: 10 (ref 5–15)
BUN: 18 mg/dL (ref 8–23)
CO2: 30 mmol/L (ref 22–32)
Calcium: 9.6 mg/dL (ref 8.9–10.3)
Chloride: 99 mmol/L (ref 98–111)
Creatinine, Ser: 1.4 mg/dL — ABNORMAL HIGH (ref 0.61–1.24)
GFR, Estimated: 52 mL/min — ABNORMAL LOW (ref >60)
Glucose, Bld: 126 mg/dL — ABNORMAL HIGH (ref 70–99)
Potassium: 4 mmol/L (ref 3.5–5.1)
Sodium: 139 mmol/L (ref 135–145)
Total Bilirubin: 0.6 mg/dL (ref 0.3–1.2)
Total Protein: 7.3 g/dL (ref 6.5–8.1)

## 2022-08-28 LAB — CBC WITH DIFFERENTIAL/PLATELET
Abs Immature Granulocytes: 0.06 10*3/uL (ref 0.00–0.07)
Basophils Absolute: 0.1 10*3/uL (ref 0.0–0.1)
Basophils Relative: 1 %
Eosinophils Absolute: 0.2 10*3/uL (ref 0.0–0.5)
Eosinophils Relative: 2 %
HCT: 42 % (ref 39.0–52.0)
Hemoglobin: 13.3 g/dL (ref 13.0–17.0)
Immature Granulocytes: 1 %
Lymphocytes Relative: 13 %
Lymphs Abs: 1.3 10*3/uL (ref 0.7–4.0)
MCH: 27.3 pg (ref 26.0–34.0)
MCHC: 31.7 g/dL (ref 30.0–36.0)
MCV: 86.2 fL (ref 80.0–100.0)
Monocytes Absolute: 0.8 10*3/uL (ref 0.1–1.0)
Monocytes Relative: 7 %
Neutro Abs: 7.9 10*3/uL — ABNORMAL HIGH (ref 1.7–7.7)
Neutrophils Relative %: 76 %
Platelets: 304 10*3/uL (ref 150–400)
RBC: 4.87 MIL/uL (ref 4.22–5.81)
RDW: 13.9 % (ref 11.5–15.5)
WBC: 10.3 10*3/uL (ref 4.0–10.5)
nRBC: 0 % (ref 0.0–0.2)

## 2022-08-28 LAB — TROPONIN I (HIGH SENSITIVITY)
Troponin I (High Sensitivity): 5 ng/L (ref <18)
Troponin I (High Sensitivity): 4 ng/L (ref <18)

## 2022-08-28 MED ORDER — MORPHINE SULFATE (PF) 4 MG/ML IV SOLN
4.0000 mg | Freq: Once | INTRAVENOUS | Status: AC
  Administered 2022-08-28: 4 mg via INTRAVENOUS
  Filled 2022-08-28: qty 1

## 2022-08-28 MED ORDER — SODIUM CHLORIDE 0.9 % IV BOLUS
1000.0000 mL | Freq: Once | INTRAVENOUS | Status: AC
  Administered 2022-08-28: 1000 mL via INTRAVENOUS

## 2022-08-28 MED ORDER — COLCHICINE 0.6 MG PO TABS
0.6000 mg | ORAL_TABLET | ORAL | Status: AC
  Administered 2022-08-28: 0.6 mg via ORAL
  Filled 2022-08-28 (×2): qty 1

## 2022-08-28 MED ORDER — NAPROXEN 500 MG PO TABS: 500.0000 mg | ORAL_TABLET | Freq: Two times a day (BID) | ORAL | 0 refills | Status: AC

## 2022-08-28 NOTE — ED Notes (Signed)
Pt transported to CT ?

## 2022-08-28 NOTE — ED Triage Notes (Signed)
Pt comes in via [facility] from a [place] station due to a syncope episode. According to pt, he started feeling dizzy before he passed out, but doesn't recall passing out. According to EMS, pt hit his head on a wall, but there is no visible deformity. Pt noted to have edema around both ankles. Pt has a history of diabetes, and gout. Pt is alert and oriented x4, and with no obvious signs of distress at this time. Patient has a 20 gauge in the left antecubital.  EMS Vitals: Bp: 66/37 (initial) 152/73 (After foot elevation) Hr: 66 Spo2: 97% Blood Sugar: 143

## 2022-08-28 NOTE — ED Notes (Signed)
Pt ambulated in room with no signs of dizziness or weakness. MD Jari Pigg notified

## 2022-08-28 NOTE — ED Provider Notes (Signed)
[facility] Provider Note    Event Date/Time   First MD Initiated Contact with Patient 08/28/22 1243     (approximate)   History   Chief Complaint: Loss of Consciousness   HPI  Anthony Roy is a 77 y.o. male with a history of hypertension diabetes who comes ED due to syncope.  Patient was at a gas station and started to feel lightheaded.  He then passed out.  Denies any preceding pain, headache, vision changes, shortness of breath.  As he fell he hit his head on the wall.  He quickly regained consciousness.  EMS report initial blood pressure was low, 66/37.  It quickly normalized.  Currently patient denies any acute complaints other than pain in his right great toe which has been present for several days and he attributes to gout.     Physical Exam   Triage Vital Signs: ED Triage Vitals  Enc Vitals Group     BP 08/28/22 1243 (!) 154/96     Pulse Rate 08/28/22 1243 79     Resp 08/28/22 1243 (!) 21     Temp 08/28/22 1243 98.3 F (36.8 C)     Temp Source 08/28/22 1243 Oral     SpO2 08/28/22 1243 98 %     Weight --      Height --      Head Circumference --      Peak Flow --      Pain Score 08/28/22 1252 6     Pain Loc --      Pain Edu? --      Excl. in [place]? --     Most recent vital signs: Vitals:   08/28/22 1243  BP: (!) 154/96  Pulse: 79  Resp: (!) 21  Temp: 98.3 F (36.8 C)  SpO2: 98%    General: Awake, no distress.  CV:  Good peripheral perfusion.  Regular rate and rhythm.  Normal distal pulses Resp:  Normal effort.  Clear to auscultation bilaterally Abd:  No distention.  Soft nontender Other:  No lower extremity edema.  No signs of head trauma.  No midline spinal tenderness.  Cranial nerves III through XII intact.  Moving all extremities.  Normal coordination.  Symmetric motor function.   ED Results / Procedures / Treatments   Labs (all labs ordered are listed, but only abnormal results are displayed) Labs Reviewed   COMPREHENSIVE METABOLIC PANEL - Abnormal; Notable for the following components:      Result Value   Glucose, Bld 126 (*)    Creatinine, Ser 1.40 (*)    GFR, Estimated 52 (*)    All other components within normal limits  CBC WITH DIFFERENTIAL/PLATELET  CBC WITH DIFFERENTIAL/PLATELET     EKG Interpreted by me Sinus rhythm, rate of 74.  Normal axis, normal intervals.  Normal QRS ST segments and T waves.   RADIOLOGY CT head interpreted by me, negative for intracranial hemorrhage.  Radiology report reviewed.  CT cervical spine unremarkable  X-ray right foot unremarkable   PROCEDURES:  Procedures   MEDICATIONS ORDERED IN ED: Medications  sodium chloride 0.9 % bolus 1,000 mL (0 mLs Intravenous Stopped 08/28/22 1441)  morphine (PF) 4 MG/ML injection 4 mg (4 mg Intravenous Given 08/28/22 1305)  colchicine tablet 0.6 mg (0.6 mg Oral Given 08/28/22 1451)     IMPRESSION / MDM / ASSESSMENT AND PLAN / ED COURSE  I reviewed the triage vital signs and the nursing notes.  DDx: Dehydration, electrolyte  abnormality, AKI, anemia, intracranial hemorrhage, C-spine fracture, right foot fracture  Patient's presentation is most consistent with acute presentation with potential threat to life or bodily function.  Patient presents with an episode of syncope, likely vagal episode.  He does appear to be somewhat dehydrated, will give IV fluids while checking labs.  CT head and cervical spine unremarkable.   ----------------------------------------- 2:54 PM on 08/28/2022 ----------------------------------------- Remains asymptomatic except for gout related pain and right great toe.  Doubt septic arthritis, osteomyelitis, or soft tissue infection..  Sitting upright.  Will give colchicine for gout, recommend course of naproxen.  Will follow-up CBC and then discharge unless there are severe findings.      FINAL CLINICAL IMPRESSION(S) / ED DIAGNOSES   Final diagnoses:  Vasovagal syncope   Acute idiopathic gout involving toe of right foot     Rx / DC Orders   ED Discharge Orders          Ordered    naproxen (NAPROSYN) 500 MG tablet  2 times daily with meals        08/28/22 1453             Note:  This document was prepared using Dragon voice recognition software and may include unintentional dictation errors.   Carrie Mew, MD 08/28/22 [phone]

## 2022-08-28 NOTE — ED Provider Notes (Signed)
5:09 PM Assumed care for off going team.   Blood pressure (!) 127/51, pulse 66, temperature 99 F (37.2 C), temperature source Oral, resp. rate (!) 22, SpO2 97 %.  See their HPI for full report but in brief pending dc after cbc  Troponins are negative x 2.  CBC is reassuring CMP shows creatinine around baseline.  Reevaluated patient we discussed admission versus going home for syncopal and he feels comfortable with going home.  We did place a cardiology referral for syncopal episode.  Patient reports feeling at his baseline self has been ambulatory around the room.  He was already written discharge instructions by Dr. Joni Fears.       Vanessa Bellamy, MD 08/28/22 [phone]

## 2022-08-28 NOTE — ED Notes (Signed)
Xray at the bedside.

## 2022-08-28 NOTE — ED Provider Notes (Signed)
[facility] Provider Note    Event Date/Time   First MD Initiated Contact with Patient 08/28/22 1243     (approximate)   History   Chief Complaint: Loss of Consciousness   HPI  Anthony Roy is a 77 y.o. male with a history of hypertension and diabetes who is brought to the ED from a gas station due to syncope.  He reports that he was standing there and then started to feel lightheaded and then passed out, hitting his head on a wall.  He denies any headache, denies any chest pain shortness of breath or palpitations.  No vomiting diarrhea or fever.  Does complain of pain in his great toe related to gout which has been worse lately.     Physical Exam   Triage Vital Signs: ED Triage Vitals [08/28/22 1243]  Enc Vitals Group     BP (!) 154/96     Pulse Rate 79     Resp (!) 21     Temp 98.3 F (36.8 C)     Temp Source Oral     SpO2 98 %     Weight      Height      Head Circumference      Peak Flow      Pain Score      Pain Loc      Pain Edu?      Excl. in [place]?     Most recent vital signs: Vitals:   08/28/22 1615 08/28/22 1703  BP:    Pulse: 66   Resp: (!) 22   Temp:  99 F (37.2 C)  SpO2: 97%     General: Awake, no distress.  CV:  Good peripheral perfusion.  Regular rate and rhythm.  Normal distal pulses Resp:  Normal effort.  Clear to auscultation bilaterally Abd:  No distention.  Soft nontender Other:  No signs of trauma.  No midline spinal tenderness.   ED Results / Procedures / Treatments   Labs (all labs ordered are listed, but only abnormal results are displayed) Labs Reviewed  COMPREHENSIVE METABOLIC PANEL - Abnormal; Notable for the following components:      Result Value   Glucose, Bld 126 (*)    Creatinine, Ser 1.40 (*)    GFR, Estimated 52 (*)    All other components within normal limits  CBC WITH DIFFERENTIAL/PLATELET - Abnormal; Notable for the following components:   Neutro Abs 7.9 (*)    All other components  within normal limits  CBC WITH DIFFERENTIAL/PLATELET  TROPONIN I (HIGH SENSITIVITY)  TROPONIN I (HIGH SENSITIVITY)     EKG Interpreted by me Normal sinus rhythm, rate of 74.  Normal axis intervals QRS ST segments and T waves.  No ischemic changes or evidence of arrhythmia   RADIOLOGY CT head interpreted by me, negative for intracranial hemorrhage.  Radiology report reviewed.  CT cervical spine negative  X-ray right foot unremarkable, chronic arthritis changes   PROCEDURES:  Procedures   MEDICATIONS ORDERED IN ED: Medications  sodium chloride 0.9 % bolus 1,000 mL (0 mLs Intravenous Stopped 08/28/22 1441)  morphine (PF) 4 MG/ML injection 4 mg (4 mg Intravenous Given 08/28/22 1305)  colchicine tablet 0.6 mg (0.6 mg Oral Given 08/28/22 1451)     IMPRESSION / MDM / ASSESSMENT AND PLAN / ED COURSE  I reviewed the triage vital signs and the nursing notes.  DDx: Dehydration, electrolyte abnormality, AKI, gout flare, anemia, intracranial hemorrhage, C-spine fracture, foot fracture  Patient's presentation is most consistent with acute presentation with potential threat to life or bodily function.  Patient presents with syncope without worrisome associated symptoms.  Vital signs unremarkable, on arrival he is feeling back to baseline.  EKG and labs unremarkable.  Trauma evaluation unremarkable.  Patient given IV fluids for hydration, feeling well sitting upright without orthostatic symptoms.  Stable for discharge.       FINAL CLINICAL IMPRESSION(S) / ED DIAGNOSES   Final diagnoses:  Vasovagal syncope  Acute idiopathic gout involving toe of right foot  Dehydration   Rx / DC Orders   ED Discharge Orders          Ordered    naproxen (NAPROSYN) 500 MG tablet  2 times daily with meals        08/28/22 1453    Ambulatory referral to Cardiology        08/28/22 1712             Note:  This document was prepared using Dragon voice recognition software and may include  unintentional dictation errors.   Carrie Mew, MD 08/31/22 [phone]
